# Patient Record
Sex: Male | Born: 1975 | Race: White | Hispanic: No | Marital: Married | Smoking: Never smoker
Health system: Southern US, Community
[De-identification: ages and names within clinical notes are randomized; demographics above are authoritative.]

## PROBLEM LIST (undated history)

## (undated) DIAGNOSIS — R569 Unspecified convulsions: Secondary | ICD-10-CM

## (undated) DIAGNOSIS — L0501 Pilonidal cyst with abscess: Secondary | ICD-10-CM

## (undated) DIAGNOSIS — Q031 Atresia of foramina of Magendie and Luschka: Secondary | ICD-10-CM

## (undated) DIAGNOSIS — G43009 Migraine without aura, not intractable, without status migrainosus: Secondary | ICD-10-CM

## (undated) DIAGNOSIS — I10 Essential (primary) hypertension: Secondary | ICD-10-CM

## (undated) DIAGNOSIS — F419 Anxiety disorder, unspecified: Secondary | ICD-10-CM

## (undated) DIAGNOSIS — T7840XA Allergy, unspecified, initial encounter: Secondary | ICD-10-CM

## (undated) DIAGNOSIS — M009 Pyogenic arthritis, unspecified: Secondary | ICD-10-CM

## (undated) HISTORY — DX: Unspecified convulsions: R56.9

## (undated) HISTORY — DX: Pyogenic arthritis, unspecified: M00.9

## (undated) HISTORY — DX: Pilonidal cyst with abscess: L05.01

## (undated) HISTORY — PX: HIP SURGERY: SHX245

## (undated) HISTORY — PX: KIDNEY STONE SURGERY: SHX686

## (undated) HISTORY — DX: Migraine without aura, not intractable, without status migrainosus: G43.009

## (undated) HISTORY — DX: Essential (primary) hypertension: I10

## (undated) HISTORY — DX: Anxiety disorder, unspecified: F41.9

## (undated) HISTORY — DX: Allergy, unspecified, initial encounter: T78.40XA

## (undated) HISTORY — DX: Atresia of foramina of Magendie and Luschka: Q03.1

---

## 2012-01-16 ENCOUNTER — Ambulatory Visit: Payer: Medicare Other

## 2012-01-16 ENCOUNTER — Ambulatory Visit (INDEPENDENT_AMBULATORY_CARE_PROVIDER_SITE_OTHER): Payer: Medicare Other | Admitting: Family Medicine

## 2012-01-16 VITALS — BP 122/84 | HR 123 | Temp 97.5°F | Resp 18 | Ht 69.5 in | Wt 208.0 lb

## 2012-01-16 DIAGNOSIS — D72829 Elevated white blood cell count, unspecified: Secondary | ICD-10-CM

## 2012-01-16 DIAGNOSIS — M009 Pyogenic arthritis, unspecified: Secondary | ICD-10-CM | POA: Insufficient documentation

## 2012-01-16 DIAGNOSIS — R509 Fever, unspecified: Secondary | ICD-10-CM

## 2012-01-16 DIAGNOSIS — Q031 Atresia of foramina of Magendie and Luschka: Secondary | ICD-10-CM | POA: Insufficient documentation

## 2012-01-16 DIAGNOSIS — M549 Dorsalgia, unspecified: Secondary | ICD-10-CM

## 2012-01-16 DIAGNOSIS — R1030 Lower abdominal pain, unspecified: Secondary | ICD-10-CM

## 2012-01-16 DIAGNOSIS — L0501 Pilonidal cyst with abscess: Secondary | ICD-10-CM | POA: Insufficient documentation

## 2012-01-16 DIAGNOSIS — N390 Urinary tract infection, site not specified: Secondary | ICD-10-CM

## 2012-01-16 DIAGNOSIS — R109 Unspecified abdominal pain: Secondary | ICD-10-CM

## 2012-01-16 LAB — POCT UA - MICROSCOPIC ONLY
Casts, Ur, LPF, POC: NEGATIVE
Crystals, Ur, HPF, POC: NEGATIVE
Yeast, UA: NEGATIVE

## 2012-01-16 LAB — POCT CBC
Granulocyte percent: 80.4 %G — AB (ref 37–80)
HCT, POC: 50.8 % (ref 43.5–53.7)
Hemoglobin: 16.6 g/dL (ref 14.1–18.1)
Lymph, poc: 2.3 (ref 0.6–3.4)
MCV: 95.2 fL (ref 80–97)
POC Granulocyte: 13.5 — AB (ref 2–6.9)
POC LYMPH PERCENT: 13.5 %L (ref 10–50)
RDW, POC: 13.2 %

## 2012-01-16 LAB — POCT URINALYSIS DIPSTICK
Glucose, UA: 500
Nitrite, UA: NEGATIVE
Protein, UA: >300
Spec Grav, UA: >=1.03
Urobilinogen, UA: 0.2

## 2012-01-16 LAB — PSA: PSA: 1.56 ng/mL (ref <=4.00)

## 2012-01-16 MED ORDER — CEFTRIAXONE SODIUM 1 G IJ SOLR: 1.0000 g | INTRAMUSCULAR | Status: DC

## 2012-01-16 MED ORDER — CIPROFLOXACIN HCL 500 MG PO TABS: 500.0000 mg | ORAL_TABLET | Freq: Two times a day (BID) | ORAL | Status: DC

## 2012-01-16 MED ORDER — CEFTRIAXONE SODIUM 1 G IJ SOLR
1.0000 g | Freq: Once | INTRAMUSCULAR | Status: AC
  Administered 2012-01-16: 1 g via INTRAMUSCULAR

## 2012-01-16 NOTE — Patient Instructions (Signed)
Start the antibiotics for a likely urinary tract infection.   Your should receive a call or letter about your lab results within the next week to 10 days. Recheck in the next few days if not significantly improved, as well as in 2 weeks to recheck urine test.  Return to the clinic or go to the nearest emergency room if any of your symptoms worsen or new symptoms occur.

## 2012-01-16 NOTE — Progress Notes (Signed)
Subjective:    Patient ID: Jack Mendoza, male    DOB: 1975/11/19, 36 y.o.   MRN: [id number]  HPI Jack Mendoza is a 36 y.o. 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  Presents with mom.  New patient to our office - usual primary provider: [name] in [place].   Started with backache and stinging with urination starting yesterday, some soreness into groin. Pain in back of arms of legs.  Fever this morning - 100.0.  Sweats. No bowel or bladder incontinence, no saddle anesthesia, no lower extremity weakness. Sore in testicle/groin area after intercourse yesterday.    Tx: tylenol.   Hx of UTI in past - years ago.  Hxof Kidney stone in past - last one  9 months ago.  [place] in [place].      Review of Systems  Constitutional: Positive for fever and chills.  HENT: Positive for postnasal drip. Negative for congestion.   Respiratory: Negative for cough and shortness of breath.   Gastrointestinal: Positive for abdominal pain.  Genitourinary: Positive for dysuria. Negative for frequency and hematuria.  Musculoskeletal: Positive for back pain. Negative for joint swelling and gait problem.       Objective:   Physical Exam  Constitutional: He is oriented to person, place, and time. He appears well-developed and well-nourished.  HENT:  Head: Normocephalic and atraumatic.  Eyes: EOM are normal. Pupils are equal, round, and reactive to light.  Neck: Normal range of motion. Neck supple.  Cardiovascular: Normal rate, regular rhythm, normal heart sounds and intact distal pulses.   Pulmonary/Chest: Effort normal and breath sounds normal.  Abdominal: Soft. Normal appearance and bowel sounds are normal. He exhibits no distension. There is tenderness in the suprapubic area. No hernia.    Genitourinary: Penis normal. Right testis shows no mass, no swelling and no tenderness. Left testis shows no mass, no swelling and no tenderness.    Musculoskeletal:       Lumbar back: He exhibits normal range of motion and no bony tenderness.       No focal ttp on back. No cva ttp.   Neurological: He is alert and oriented to person, place, and time.  Skin: Skin is warm and dry. No rash noted.  Psychiatric: He has a normal mood and affect. His behavior is normal.   Results for orders placed in visit on 01/16/12  POCT UA - MICROSCOPIC ONLY      Component Value Range   WBC, Ur, HPF, POC 20-25     RBC, urine, microscopic 0-3     Bacteria, U Microscopic trace     Mucus, UA trace     Epithelial cells, urine per micros 0-1     Crystals, Ur, HPF, POC neg     Casts, Ur, LPF, POC neg     Yeast, UA neg    POCT URINALYSIS DIPSTICK      Component Value Range   Color, UA dk yellow     Clarity, UA cloudy     Glucose, UA 500     Bilirubin, UA small     Ketones, UA 15     Spec Grav, UA >=1.030     Blood, UA trace     pH, UA 5.5     Protein, UA >300     Urobilinogen, UA 0.2     Nitrite, UA neg     Leukocytes, UA Negative    POCT CBC  Component Value Range   WBC 16.8 (*) 4.6 - 10.2 K/uL   Lymph, poc 2.3  0.6 - 3.4   POC LYMPH PERCENT 13.5  10 - 50 %L   MID (cbc) 1.0 (*) 0 - 0.9   POC MID % 6.1  0 - 12 %M   POC Granulocyte 13.5 (*) 2 - 6.9   Granulocyte percent 80.4 (*) 37 - 80 %G   RBC 5.34  4.69 - 6.13 M/uL   Hemoglobin 16.6  14.1 - 18.1 g/dL   HCT, POC 16.1  09.6 - 53.7 %   MCV 95.2  80 - 97 fL   MCH, POC 31.1  27 - 31.2 pg   MCHC 32.7  31.8 - 35.4 g/dL   RDW, POC 04.5     Platelet Count, POC 232  142 - 424 K/uL   MPV 7.5  0 - 99.8 fL   [facility] reading (PRIMARY) by  Jack Mendoza: Abd 1 view  NAD, no nephrolith identified.      Assessment & Plan:  Jack Mendoza is a 36 y.o. male 1. Groin pain  POCT UA - Microscopic Only, POCT urinalysis dipstick, DG Abd 1 View  2. Fever  POCT CBC, PSA, ciprofloxacin (CIPRO) 500 MG tablet  3. UTI (lower urinary tract infection)  Urine culture, PSA, ciprofloxacin (CIPRO) 500 MG  tablet, cefTRIAXone (ROCEPHIN) injection 1 g  4. Back pain  PSA  5. Leukocytosis  cefTRIAXone (ROCEPHIN) injection 1 g   Suspected UTI vs prostatitis.  No appretn CVA TTP to suggest pyelo, and no stone seen on KUB.  Rocephin x 1 as above, start Cipro, push fluids. Check PSA and urine cx.  RTC precautions.  Patient Instructions  Start the antibiotics for a likely urinary tract infection.   Your should receive a call or letter about your lab results within the next week to 10 days. Recheck in the next few days if not significantly improved, as well as in 2 weeks to recheck urine test.  Return to the clinic or go to the nearest emergency room if any of your symptoms worsen or new symptoms occur.

## 2012-01-18 LAB — URINE CULTURE: Colony Count: 85000

## 2012-04-27 ENCOUNTER — Ambulatory Visit: Payer: Medicare Other

## 2012-04-27 ENCOUNTER — Ambulatory Visit (INDEPENDENT_AMBULATORY_CARE_PROVIDER_SITE_OTHER): Payer: Medicare Other | Admitting: Internal Medicine

## 2012-04-27 VITALS — BP 154/99 | HR 97 | Temp 98.4°F | Resp 16 | Ht 69.5 in | Wt 202.2 lb

## 2012-04-27 DIAGNOSIS — R05 Cough: Secondary | ICD-10-CM

## 2012-04-27 DIAGNOSIS — J40 Bronchitis, not specified as acute or chronic: Secondary | ICD-10-CM

## 2012-04-27 DIAGNOSIS — R059 Cough, unspecified: Secondary | ICD-10-CM

## 2012-04-27 DIAGNOSIS — J029 Acute pharyngitis, unspecified: Secondary | ICD-10-CM

## 2012-04-27 LAB — POCT CBC
HCT, POC: 50.6 % (ref 43.5–53.7)
Hemoglobin: 16 g/dL (ref 14.1–18.1)
Lymph, poc: 2.5 (ref 0.6–3.4)
MCH, POC: 29.9 pg (ref 27–31.2)
MCHC: 31.6 g/dL — AB (ref 31.8–35.4)
MCV: 94.4 fL (ref 80–97)
MPV: 8 fL (ref 0–99.8)
POC MID %: 6.9 %M (ref 0–12)
RBC: 5.36 M/uL (ref 4.69–6.13)
WBC: 7.3 10*3/uL (ref 4.6–10.2)

## 2012-04-27 MED ORDER — AZITHROMYCIN 500 MG PO TABS: 500.0000 mg | ORAL_TABLET | Freq: Every day | ORAL | Status: DC

## 2012-04-27 NOTE — Progress Notes (Signed)
  Subjective:    Patient ID: Jack Mendoza, male    DOB: [birth year], 37 y.o.   MRN: [id number]  Sore Throat  Associated symptoms include coughing.  Sinusitis Associated symptoms include coughing and a sore throat.  Cough Associated symptoms include a sore throat.  Cough for several days, sweats, no known fever, unknown if he is producing sputum. Non smoker Onset 5 days ago, moderate in severity. Aunt has pneumonia and influenza is hospitalized at present,  Review of Systems  HENT: Positive for sore throat.   Respiratory: Positive for cough.        Objective:   Physical Exam  Constitutional: He is oriented to person, place, and time. He appears well-developed and well-nourished.  HENT:  Head: Normocephalic and atraumatic.  Right Ear: External ear normal.  Left Ear: External ear normal.  Nose: Nose normal.  Eyes: Conjunctivae normal and EOM are normal. Pupils are equal, round, and reactive to light.  Neck: Normal range of motion. Neck supple.  Cardiovascular: Normal rate, regular rhythm, normal heart sounds and intact distal pulses.   Pulmonary/Chest: Effort normal. No respiratory distress.       Coarse rhonchi bilaterally  Abdominal: Soft. Bowel sounds are normal.  Musculoskeletal: Normal range of motion.  Lymphadenopathy:    He has no cervical adenopathy.  Neurological: He is alert and oriented to person, place, and time.  Skin: Skin is warm and dry.  Psychiatric: He has a normal mood and affect. His behavior is normal. Judgment and thought content normal.     Results for orders placed in visit on 04/27/12  POCT CBC      Component Value Range   WBC 7.3  4.6 - 10.2 K/uL   Lymph, poc 2.5  0.6 - 3.4   POC LYMPH PERCENT 34.8  10 - 50 %L   MID (cbc) 0.5  0 - 0.9   POC MID % 6.9  0 - 12 %M   POC Granulocyte 4.3  2 - 6.9   Granulocyte percent 58.3  37 - 80 %G   RBC 5.36  4.69 - 6.13 M/uL   Hemoglobin 16.0  14.1 - 18.1 g/dL   HCT, POC 46.9  62.9 - 53.7 %   MCV  94.4  80 - 97 fL   MCH, POC 29.9  27 - 31.2 pg   MCHC 31.6 (*) 31.8 - 35.4 g/dL   RDW, POC 52.8     Platelet Count, POC 269  142 - 424 K/uL   MPV 8.0  0 - 99.8 fL  POCT INFLUENZA A/B      Component Value Range   Influenza A, POC Negative     Influenza B, POC Negative     Normal cbc and influenza test is negitive   [facility] reading (PRIMARY) by  Dr. Mindi Junker increased bronchial markings no pneumonia..  Assessment & Plan:  Bronchial infection with pharyngitis. Pt is immunocompromised because of his diabetes.I will eval for influenza and pneumonia. Chest xray is consistent with bronchitis and influenza is negative.I will treat with Zithromax .I have instructed the patient to return to get the influenza vaccination when he recovers from this illness.

## 2012-04-27 NOTE — Patient Instructions (Signed)
Take meds as directed. follow up if no improvement in 3 days.

## 2013-09-24 IMAGING — CR DG CHEST 2V
2 series · 2 of 2 positions shown · non-contrast
Comparison: None

CLINICAL DATA: Cough

CHEST - 2 VIEW

[PA]
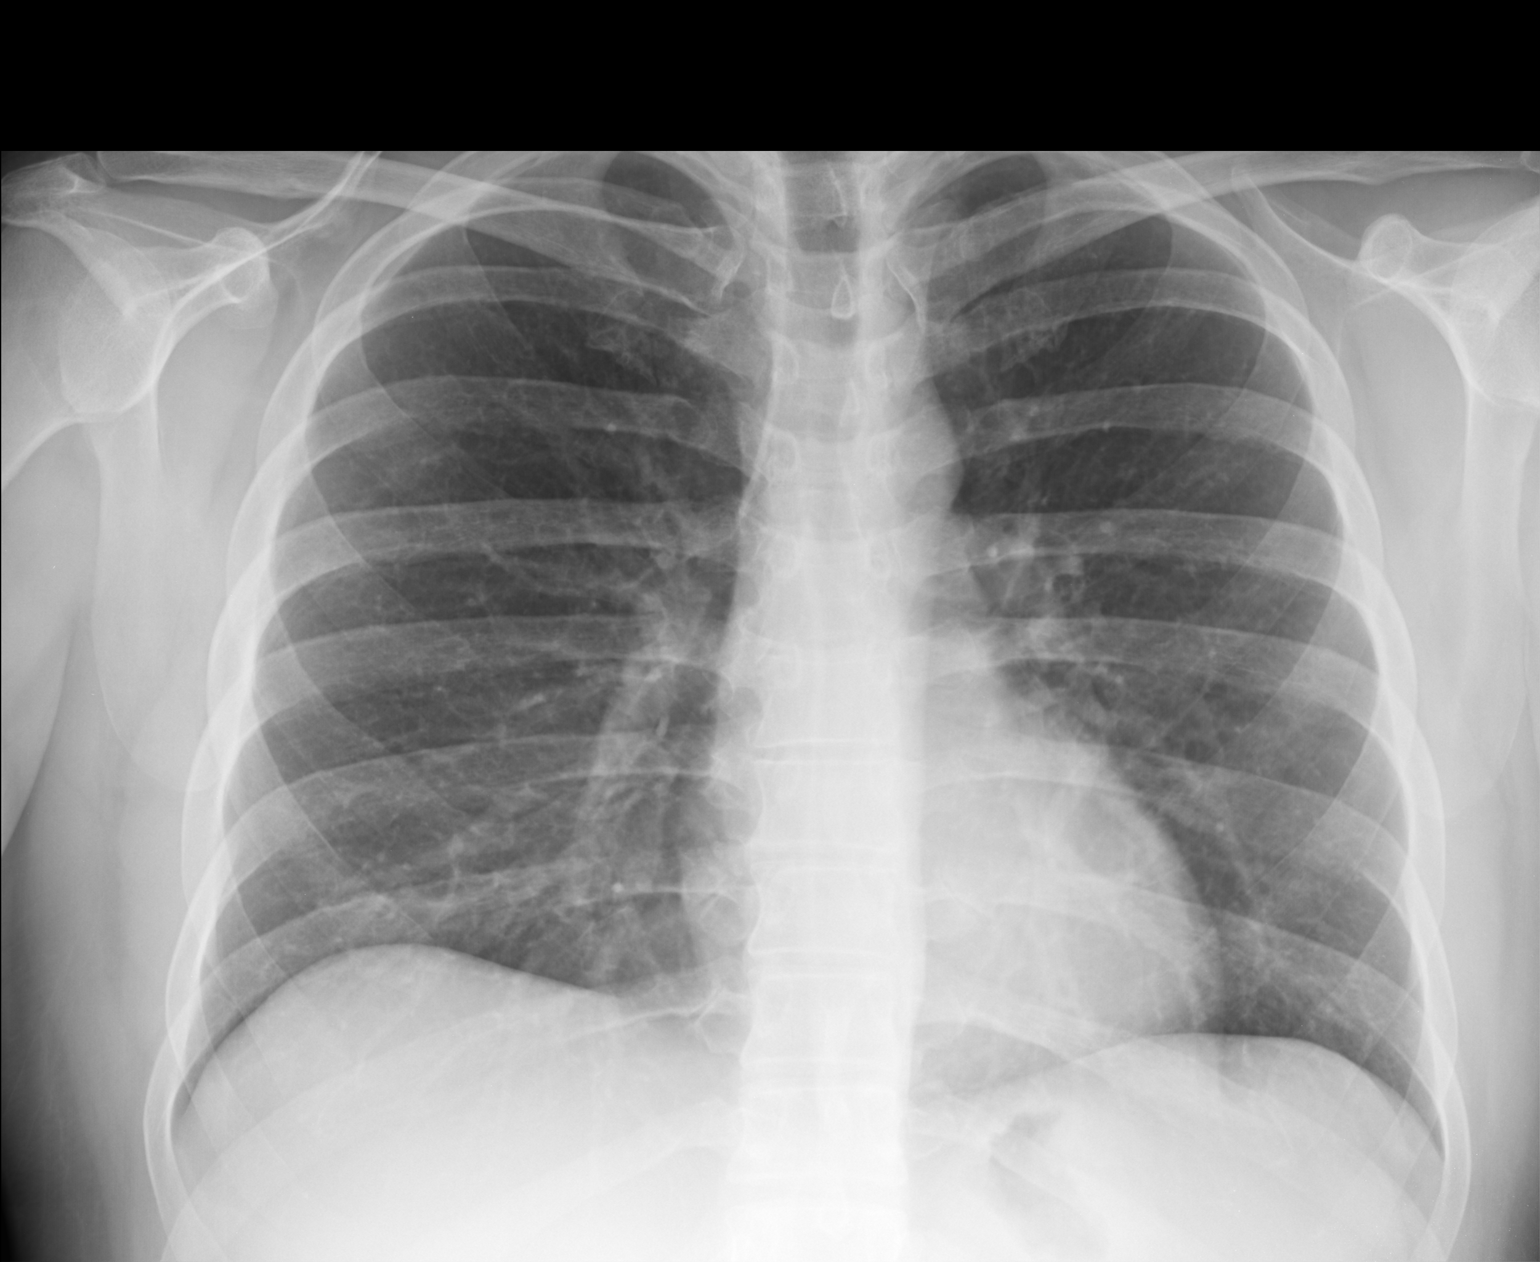

[lateral]
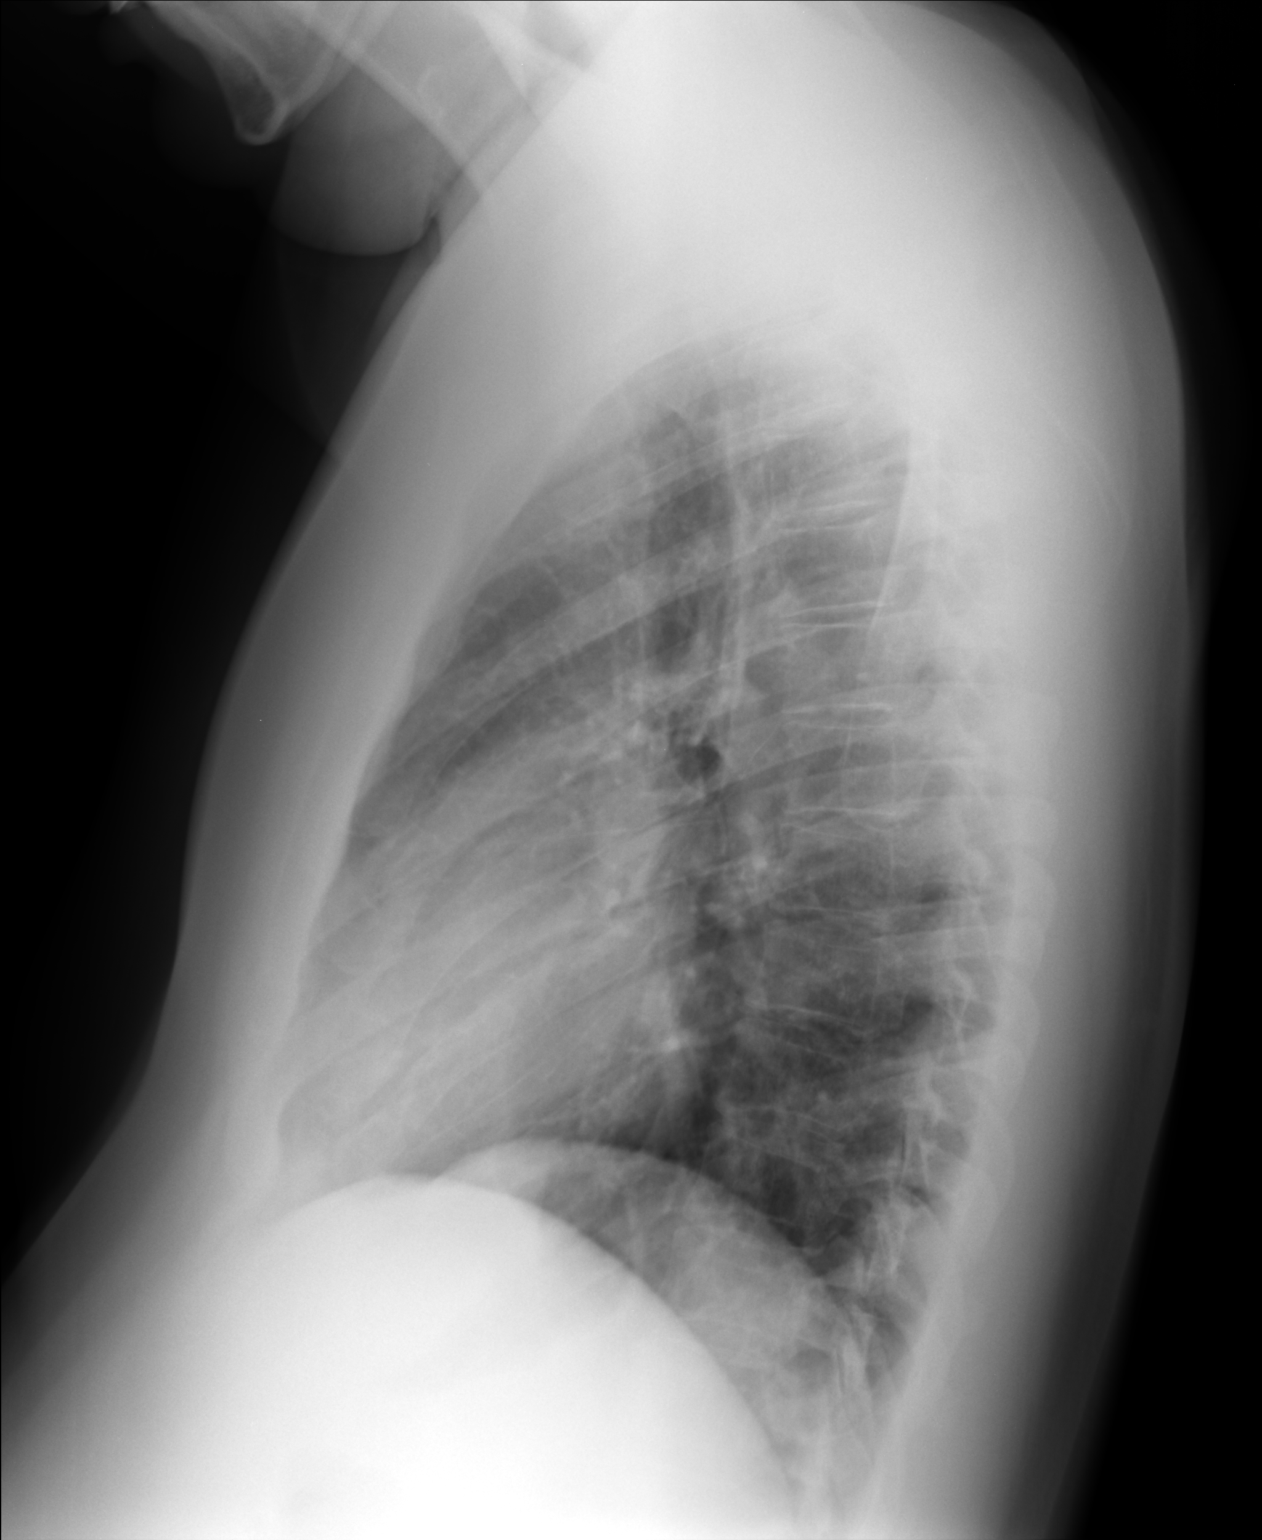

[2 of 2 positions shown; findings below may reference images not displayed]

FINDINGS: Normal heart size.  No pleural effusion or edema. The
trachea is midline.   Normal lung volumes.  No airspace
consolidation.
IMPRESSION: 1.  No acute cardiopulmonary abnormalities.

## 2019-07-27 ENCOUNTER — Encounter: Payer: Self-pay | Admitting: Emergency Medicine

## 2019-07-27 ENCOUNTER — Ambulatory Visit
Admission: EM | Admit: 2019-07-27 | Discharge: 2019-07-27 | Disposition: A | Discharge status: Home / Self Care | Payer: Medicaid Other | Attending: Physician Assistant | Admitting: Physician Assistant

## 2019-07-27 ENCOUNTER — Other Ambulatory Visit: Payer: Self-pay

## 2019-07-27 DIAGNOSIS — R21 Rash and other nonspecific skin eruption: Secondary | ICD-10-CM

## 2019-07-27 MED ORDER — CIPROFLOXACIN HCL 500 MG PO TABS: 500.0000 mg | ORAL_TABLET | Freq: Two times a day (BID) | ORAL | 0 refills | Status: AC

## 2019-07-27 MED ORDER — CETIRIZINE HCL 10 MG PO TABS: 10.0000 mg | ORAL_TABLET | Freq: Every day | ORAL | 0 refills | Status: AC

## 2019-07-27 NOTE — Discharge Instructions (Signed)
Start ciprofloxacin to cover for skin infection. Zyrtec for possible bug bites. Avoid irritation to the area. Monitor for spreading redness, warmth, fever, follow up for reevaluation needed.

## 2019-07-27 NOTE — ED Provider Notes (Signed)
[name]    CSN: [id number] Arrival date & time: 07/27/19  [name]      History   Chief Complaint Chief Complaint  Patient presents with  . Rash    HPI Jack Mendoza is a 44 y.o. male.   44 year old male comes in for 2 day history of rash to the abdomen, buttock, back. States this started after using the hot tub. Denies spreading of the rash, erythema, warmth. Denies fever. Denies itching. States area is painful. Has not tried anything to the area.       Past Medical History:  Diagnosis Date  . Allergy   . Anxiety   . 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 Diabetes mellitus   . Headache, common migraine   . Hypertension   . Pilonidal cyst with abscess   . Seizures ([place])   . 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 Septic hip ([place])   . Pilonidal cyst with abscess   . Dandy-Walker syndrome [facility])     Past Surgical History:  Procedure Laterality Date  . HIP SURGERY    . KIDNEY STONE SURGERY     twice      Home Medications    Prior to Admission medications   Medication Sig Start Date End Date Taking? Authorizing Provider  carbamazepine (TEGRETOL) 200 MG tablet Take 100 mg by mouth as directed. 300mg  am and 400mg  pm   Yes [provider]  lisinopril (PRINIVIL,ZESTRIL) 20 MG tablet Take 20 mg by mouth daily.   Yes [provider]  metFORMIN (GLUCOPHAGE) 1000 MG tablet Take 1,000 mg by mouth 2 (two) times daily with a meal.   Yes [provider]  cetirizine (ZYRTEC ALLERGY) 10 MG tablet Take 1 tablet (10 mg total) by mouth daily. 07/27/19   Tasia Catchings, Markel Mergenthaler V, PA-C  ciprofloxacin (CIPRO) 500 MG tablet Take 1 tablet (500 mg total) by mouth every 12 (twelve) hours. 07/27/19   Ok Edwards, PA-C    Family History Family History  Problem Relation Age of Onset  . Hypertension Father   . Diabetes Father   . Cancer Maternal Grandmother   . Glaucoma Maternal Grandmother   . Emphysema  Maternal Grandfather   . Tuberculosis Maternal Grandfather   . Rheum arthritis Paternal Grandmother   . Hypertension Paternal Grandfather   . Diabetes type II Paternal Grandfather     Social History Social History   Tobacco Use  . Smoking status: Never Smoker  . Smokeless tobacco: Never Used  Substance Use Topics  . Alcohol use: No  . Drug use: Never     Allergies   Codeine   Review of Systems Review of Systems  Reason unable to perform ROS: See HPI as above.     Physical Exam Triage Vital Signs ED Triage Vitals  Enc Vitals Group     BP 07/27/19 1836 (!) 149/100     Pulse Rate 07/27/19 1836 100     Resp 07/27/19 1836 16     Temp 07/27/19 1836 97.8 F (36.6 C)     Temp Source 07/27/19 1836 Oral     SpO2 07/27/19 1836 97 %     Weight 07/27/19 1834 203 lb (92.1 kg)     Height 07/27/19 1834 5\' 9"  (1.753 m)     Head Circumference --      Peak Flow --      Pain Score  07/27/19 1834 2     Pain Loc --      Pain Edu? --      Excl. in GC? --    No data found.  Updated Vital Signs BP (!) 149/100 (BP Location: Left Arm)   Pulse 100   Temp 97.8 F (36.6 C) (Oral)   Resp 16   Ht 5\' 9"  (1.753 m)   Wt 203 lb (92.1 kg)   SpO2 97%   BMI 29.98 kg/m   Physical Exam Constitutional:      General: He is not in acute distress.    Appearance: Normal appearance. He is well-developed. He is not toxic-appearing or diaphoretic.  HENT:     Head: Normocephalic and atraumatic.  Eyes:     Conjunctiva/sclera: Conjunctivae normal.     Pupils: Pupils are equal, round, and reactive to light.  Pulmonary:     Effort: Pulmonary effort is normal. No respiratory distress.     Comments: Speaking in full sentences without difficulty Musculoskeletal:     Cervical back: Normal range of motion and neck supple.  Skin:    General: Skin is warm and dry.     Comments: Diffuse maculopapular rash to the buttock, low abdomen. No vesicles/bulla. Surrounding erythema without warmth. Tender to  touch  Neurological:     Mental Status: He is alert and oriented to person, place, and time.      UC Treatments / Results  Labs (all labs ordered are listed, but only abnormal results are displayed) Labs Reviewed - No data to display  EKG   Radiology No results found.  Procedures Procedures (including critical care time)  Medications Ordered in UC Medications - No data to display  Initial Impression / Assessment and Plan / UC Course  I have reviewed the triage vital signs and the nursing notes.  Pertinent labs & imaging results that were available during my care of the patient were reviewed by me and considered in my medical decision making (see chart for details).     ? Hot tub folliculitis vs bug bites. Given patient without itching, only with pain, likely folliculitis. Patient with uncontrolled noninsulin DM, will add on cipro to cover for Pseudomonas.  Return precautions given.  Patient expresses understanding and agrees to plan.  Final Clinical Impressions(s) / UC Diagnoses   Final diagnoses:  Rash    ED Prescriptions    Medication Sig Dispense Auth. Provider   ciprofloxacin (CIPRO) 500 MG tablet Take 1 tablet (500 mg total) by mouth every 12 (twelve) hours. 10 tablet [place] Mokry V, PA-C   cetirizine (ZYRTEC ALLERGY) 10 MG tablet Take 1 tablet (10 mg total) by mouth daily. 15 tablet [birth year]1974, PA-C     PDMP not reviewed this encounter.   Belinda Fisher, PA-C 07/28/19 [phone]

## 2019-07-27 NOTE — ED Triage Notes (Signed)
Patient c/o rash on his abdomen and back area that started yesterday after he was in a hot tub. He denies itching but states the areas are painful.

## 2019-08-02 ENCOUNTER — Ambulatory Visit: Payer: Self-pay

## 2019-08-02 ENCOUNTER — Ambulatory Visit: Payer: Medicare Other | Attending: Internal Medicine

## 2019-08-02 DIAGNOSIS — Z23 Encounter for immunization: Secondary | ICD-10-CM

## 2019-08-02 NOTE — Progress Notes (Signed)
   Covid-19 Vaccination Clinic  Name:  Jack Mendoza    MRN: [id number] DOB: Mar 17, 1976  08/02/2019  [name] was observed post Covid-19 immunization for 15 minutes without incident. He was provided with Vaccine Information Sheet and instruction to access the V-Safe system.   [name] was instructed to call 911 with any severe reactions post vaccine: Marland Kitchen Difficulty breathing  . Swelling of face and throat  . A fast heartbeat  . A bad rash all over body  . Dizziness and weakness   Immunizations Administered    Name Date Dose VIS Date Route   Pfizer COVID-19 Vaccine 08/02/2019  3:37 PM 0.3 mL 04/06/2019 Intramuscular   Manufacturer: [employer], [employer]   Lot: [id number]   NDC: [address]-9

## 2019-08-27 ENCOUNTER — Ambulatory Visit: Payer: Medicare Other | Attending: Internal Medicine

## 2019-08-27 DIAGNOSIS — Z23 Encounter for immunization: Secondary | ICD-10-CM

## 2019-08-27 NOTE — Progress Notes (Signed)
   Covid-19 Vaccination Clinic  Name:  Jack Mendoza    MRN: [id number] DOB: [birth year]  08/27/2019  [name] was observed post Covid-19 immunization for 15 minutes without incident. He was provided with Vaccine Information Sheet and instruction to access the V-Safe system.   [name] was instructed to call 911 with any severe reactions post vaccine: Marland Kitchen Difficulty breathing  . Swelling of face and throat  . A fast heartbeat  . A bad rash all over body  . Dizziness and weakness   Immunizations Administered    Name Date Dose VIS Date Route   Pfizer COVID-19 Vaccine 08/27/2019  3:29 PM 0.3 mL 06/20/2018 Intramuscular   Manufacturer: [employer], [employer]   Lot: [id number]   NDC: [address]-7
